# Patient Record
Sex: Female | Born: 1990 | Race: Black or African American | Hispanic: No | Marital: Single | Smoking: Never smoker
Health system: Southern US, Community
[De-identification: ages and names within clinical notes are randomized; demographics above are authoritative.]

## PROBLEM LIST (undated history)

## (undated) DIAGNOSIS — F419 Anxiety disorder, unspecified: Secondary | ICD-10-CM

## (undated) DIAGNOSIS — F32A Depression, unspecified: Secondary | ICD-10-CM

## (undated) DIAGNOSIS — F329 Major depressive disorder, single episode, unspecified: Secondary | ICD-10-CM

## (undated) DIAGNOSIS — T7840XA Allergy, unspecified, initial encounter: Secondary | ICD-10-CM

## (undated) HISTORY — DX: Anxiety disorder, unspecified: F41.9

## (undated) HISTORY — DX: Allergy, unspecified, initial encounter: T78.40XA

## (undated) HISTORY — DX: Depression, unspecified: F32.A

## (undated) HISTORY — DX: Major depressive disorder, single episode, unspecified: F32.9

---
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
## 2015-11-11 ENCOUNTER — Ambulatory Visit (INDEPENDENT_AMBULATORY_CARE_PROVIDER_SITE_OTHER): Payer: Self-pay | Admitting: Psychiatry

## 2015-11-11 ENCOUNTER — Encounter (HOSPITAL_COMMUNITY): Payer: Self-pay | Admitting: Psychiatry

## 2015-11-11 VITALS — BP 122/78 | HR 86 | Ht 60.0 in | Wt 132.0 lb

## 2015-11-11 DIAGNOSIS — F331 Major depressive disorder, recurrent, moderate: Secondary | ICD-10-CM

## 2015-11-11 DIAGNOSIS — F411 Generalized anxiety disorder: Secondary | ICD-10-CM

## 2015-11-11 MED ORDER — CITALOPRAM HYDROBROMIDE 20 MG PO TABS: 20.0000 mg | ORAL_TABLET | Freq: Every day | ORAL | Status: DC
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
## 2015-11-28 ENCOUNTER — Ambulatory Visit (INDEPENDENT_AMBULATORY_CARE_PROVIDER_SITE_OTHER): Payer: 59 | Admitting: Psychiatry

## 2015-11-28 ENCOUNTER — Encounter (HOSPITAL_COMMUNITY): Payer: Self-pay | Admitting: Psychiatry

## 2015-11-28 VITALS — BP 116/70 | HR 83 | Ht 60.0 in | Wt 132.0 lb

## 2015-11-28 DIAGNOSIS — F411 Generalized anxiety disorder: Secondary | ICD-10-CM

## 2015-11-28 DIAGNOSIS — F331 Major depressive disorder, recurrent, moderate: Secondary | ICD-10-CM | POA: Diagnosis not present

## 2015-11-28 MED ORDER — CITALOPRAM HYDROBROMIDE 20 MG PO TABS: 20.0000 mg | ORAL_TABLET | Freq: Every day | ORAL | Status: DC

## 2015-11-28 NOTE — Progress Notes (Signed)
Patient ID: Michelle Cantu, female   DOB: July 17, 1991, 25 y.o.   MRN: [id number]  Psychiatric Initial Adult Assessment   Patient Identification: Michelle Cantu MRN:  [id number] Date of Evaluation:  11/28/2015 Referral Source: Rolin Barry Therapist Chief Complaint:   Chief Complaint    Follow-up     Visit Diagnosis:    ICD-9-CM ICD-10-CM   1. Moderate episode of recurrent major depressive disorder (HCC) 296.32 F33.1   2. GAD (generalized anxiety disorder) 300.02 F41.1    Diagnosis:  There are no active problems to display for this patient.  History of Present Illness:  25 years old currently single African-American female initially referred by her therapist for evaluation of depression and management.  Patient was depressed and tearful last visit. We added Celexa she is responding less depressed. She still has some sleep concerns but overall energy and mood has improved  Aggravating factors; multiple losses. Grandparents died in 2013/12/25.Marland Kitchen Sister had a stillborn. History of dad being in prison Modifying factors; her sister and her group of friends  Location; Depression, anxiety Severity; 6 out of 10. 10 being no depression. (improved) Context; losses of family members Timing; more so the afternoon  Denies psychotic symptoms or mania.  Denies drug use or regular alcohol use.  Depression Symptoms:   Not tearful. Mood better. Sleep remains a concern (Hypo) Manic Symptoms:  Distractibility, Anxiety Symptoms:  Excessive Worry,(improving) Psychotic Symptoms:  denies     Past Medical History: History reviewed. No pertinent past medical history. History reviewed. No pertinent past surgical history. Family History:  Family History  Problem Relation Age of Onset  . Anxiety disorder Mother   . Anxiety disorder Sister    Social History:   Social History   Social History  . Marital Status: Single    Spouse Name: N/A  . Number of Children: N/A  . Years of Education: N/A    Social History Main Topics  . Smoking status: Never Smoker   . Smokeless tobacco: None  . Alcohol Use: No  . Drug Use: No  . Sexual Activity: Not Asked   Other Topics Concern  . None   Social History Narrative     Musculoskeletal: Strength & Muscle Tone: within normal limits Gait & Station: normal Patient leans: N/A  Psychiatric Specialty Exam: HPI  ROS  Blood pressure 116/70, pulse 83, height 5' (1.524 m), weight 132 lb (59.875 kg), last menstrual period 10/27/2015, SpO2 97 %.Body mass index is 25.78 kg/(m^2).  General Appearance: Casual  Eye Contact:  Fair  Speech:  Normal Rate  Volume:  Decreased  Mood:  Less dysphoric  Affect:  More reactive  Thought Process:  Coherent  Orientation:  Full (Time, Place, and Person)  Thought Content:  Rumination  Suicidal Thoughts:  No  Homicidal Thoughts:  No  Memory:  Immediate;   Fair Recent;   Fair  Judgement:  Fair  Insight:  Shallow  Psychomotor Activity:  Normal  Concentration:  Fair  Recall:  [employer] of Knowledge:Fair  Language: Fair  Akathisia:  Negative  Handed:  Right  AIMS (if indicated):    Assets:  Desire for Improvement Social Support  ADL's:  Intact  Cognition: WNL  Sleep:  Variable to poor   Is the patient at risk to self?  No. Has the patient been a risk to self in the past 6 months?  No. Has the patient been a risk to self within the distant past?  No.  Allergies:  No Known  Allergies Current Medications: Current Outpatient Prescriptions  Medication Sig Dispense Refill  . citalopram (CELEXA) 20 MG tablet Take 1 tablet (20 mg total) by mouth daily. Then start one of 20mg  a day. 30 tablet 0  . dicyclomine (BENTYL) 10 MG capsule Take 10 mg by mouth daily.     No current facility-administered medications for this visit.     Treatment Plan Summary: Medication management and Plan as follows  Major depressive disorder; continue celexa 20mg  Generalized anxiety disorder; Celexa as  above Insomnia; reviewed sleep hygiene. Will consider sleep aid if needed but not now. Patient is a nonsmoker Continue in therapy which is helpful to deal with her depression and psychosocial issues including losses. Increase activity during the day and spend some time for physical activity or  More than 50% time spent in counseling and coordination of care including patient education Call 911 or report of emergency room for any urgent concerns of suicidal thoughts Follow-up in 4 weeks or earlier if needed    Flynn Lininger 4/7/201712:08 PM

## 2015-12-08 ENCOUNTER — Ambulatory Visit: Payer: Self-pay | Admitting: Pediatrics

## 2015-12-25 ENCOUNTER — Ambulatory Visit (HOSPITAL_COMMUNITY): Payer: Self-pay | Admitting: Psychiatry

## 2016-04-01 ENCOUNTER — Emergency Department (HOSPITAL_BASED_OUTPATIENT_CLINIC_OR_DEPARTMENT_OTHER): Payer: 59

## 2016-04-01 ENCOUNTER — Emergency Department (HOSPITAL_BASED_OUTPATIENT_CLINIC_OR_DEPARTMENT_OTHER)
Admission: EM | Admit: 2016-04-01 | Discharge: 2016-04-01 | Disposition: A | Discharge status: Home / Self Care | Payer: 59 | Attending: Emergency Medicine | Admitting: Emergency Medicine

## 2016-04-01 ENCOUNTER — Encounter (HOSPITAL_BASED_OUTPATIENT_CLINIC_OR_DEPARTMENT_OTHER): Payer: Self-pay | Admitting: *Deleted

## 2016-04-01 DIAGNOSIS — Y9389 Activity, other specified: Secondary | ICD-10-CM | POA: Insufficient documentation

## 2016-04-01 DIAGNOSIS — M25512 Pain in left shoulder: Secondary | ICD-10-CM | POA: Diagnosis not present

## 2016-04-01 DIAGNOSIS — Y92410 Unspecified street and highway as the place of occurrence of the external cause: Secondary | ICD-10-CM | POA: Insufficient documentation

## 2016-04-01 DIAGNOSIS — M79632 Pain in left forearm: Secondary | ICD-10-CM | POA: Diagnosis not present

## 2016-04-01 DIAGNOSIS — S39012A Strain of muscle, fascia and tendon of lower back, initial encounter: Secondary | ICD-10-CM | POA: Diagnosis not present

## 2016-04-01 DIAGNOSIS — S0990XA Unspecified injury of head, initial encounter: Secondary | ICD-10-CM | POA: Diagnosis present

## 2016-04-01 DIAGNOSIS — S161XXA Strain of muscle, fascia and tendon at neck level, initial encounter: Secondary | ICD-10-CM | POA: Insufficient documentation

## 2016-04-01 DIAGNOSIS — Y999 Unspecified external cause status: Secondary | ICD-10-CM | POA: Insufficient documentation

## 2016-04-01 MED ORDER — FLUCONAZOLE 50 MG PO TABS: 50.0000 mg | ORAL_TABLET | Freq: Once | ORAL | Status: DC

## 2016-04-01 MED ORDER — NAPROXEN 500 MG PO TABS: 500.0000 mg | ORAL_TABLET | Freq: Two times a day (BID) | ORAL | 1 refills | Status: DC

## 2016-04-01 MED FILL — NAPROXEN 500 MG TABLET: 500 | 7 days supply | Qty: 14 | Fill #0
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
## 2016-11-02 ENCOUNTER — Emergency Department (HOSPITAL_BASED_OUTPATIENT_CLINIC_OR_DEPARTMENT_OTHER)
Admission: EM | Admit: 2016-11-02 | Discharge: 2016-11-02 | Disposition: A | Discharge status: Home / Self Care | Payer: 59 | Attending: Emergency Medicine | Admitting: Emergency Medicine

## 2016-11-02 ENCOUNTER — Encounter (HOSPITAL_BASED_OUTPATIENT_CLINIC_OR_DEPARTMENT_OTHER): Payer: Self-pay | Admitting: *Deleted

## 2016-11-02 DIAGNOSIS — J029 Acute pharyngitis, unspecified: Secondary | ICD-10-CM | POA: Diagnosis present

## 2016-11-02 DIAGNOSIS — J069 Acute upper respiratory infection, unspecified: Secondary | ICD-10-CM

## 2016-11-02 DIAGNOSIS — J45909 Unspecified asthma, uncomplicated: Secondary | ICD-10-CM | POA: Insufficient documentation

## 2016-11-02 LAB — RAPID STREP SCREEN (MED CTR MEBANE ONLY): Streptococcus, Group A Screen (Direct): NEGATIVE

## 2016-11-02 MED ORDER — ACETAMINOPHEN 325 MG PO TABS
650.0000 mg | ORAL_TABLET | Freq: Once | ORAL | Status: AC
  Administered 2016-11-02: 650 mg via ORAL
  Filled 2016-11-02: qty 2

## 2016-11-02 MED ORDER — FLUTICASONE PROPIONATE 50 MCG/ACT NA SUSP: 2.0000 | Freq: Every day | NASAL | 0 refills | Status: AC

## 2016-11-02 MED ORDER — DM-GUAIFENESIN ER 30-600 MG PO TB12: 1.0000 | ORAL_TABLET | Freq: Two times a day (BID) | ORAL | 0 refills | Status: DC | PRN

## 2016-11-02 MED ORDER — BENZONATATE 100 MG PO CAPS: 100.0000 mg | ORAL_CAPSULE | Freq: Three times a day (TID) | ORAL | 0 refills | Status: DC

## 2016-11-02 NOTE — ED Triage Notes (Signed)
Sore throat, cough, chills and body aches x 2 weeks. She took a round of Amoxicillin with no relief.

## 2016-11-02 NOTE — ED Provider Notes (Signed)
[facility] Provider Note   CSN: [id number] Arrival date & time: 11/02/16  1744   By signing my name below, I, Clarisse Gouge, attest that this documentation has been prepared under the direction and in the presence of Weylyn Ricciuti, [place]. Electronically Signed: Clarisse Gouge, Scribe. 11/02/16. 2:29 AM.   History   Chief Complaint Chief Complaint  Patient presents with  . Sore Throat  . Chills   The history is provided by the patient and medical records. No language interpreter was used.    HPI Comments: Michelle Cantu is a 26 y.o. female with Hx of asthma who presents to the Emergency Department complaining of worsened cough x 2 days. She states she was seen for sore throat and URI symptoms 2 weeks ago and prescribed amoxicillin with relief to sore throat, but she notes returned and worsened symptoms besides sore throat currently. Pt reports associated congestion, rhinorrhea, productive cough with yellow sputum last night and clear sputum today, body aches, chills, chest pain, SOB and nausea. She notes no Hx of tobacco use. Pt denies fever, chills, vomiting, diarrhea, constipation and abdominal pain.  History reviewed. No pertinent past medical history.  There are no active problems to display for this patient.   History reviewed. No pertinent surgical history.  OB History    No data available       Home Medications    Prior to Admission medications   Medication Sig Start Date End Date Taking? Authorizing Provider  benzonatate (TESSALON) 100 MG capsule Take 1 capsule (100 mg total) by mouth every 8 (eight) hours. 11/02/16   Shadawn Hanaway Manuel [place], [place]  citalopram (CELEXA) 20 MG tablet Take 1 tablet (20 mg total) by mouth daily. Then start one of 20mg  a day. 11/28/15   Thresa Ross, MD  dextromethorphan-guaiFENesin (MUCINEX DM) 30-600 MG 12hr tablet Take 1 tablet by mouth 2 (two) times daily as needed for cough. 11/02/16   Sadarius Norman [place], [place]    dicyclomine (BENTYL) 10 MG capsule Take 10 mg by mouth daily.    Historical Provider, MD  fluticasone (FLONASE) 50 MCG/ACT nasal spray Place 2 sprays into both nostrils daily. 11/02/16   Idil Maslanka Manuel , [place]  naproxen (NAPROSYN) 500 MG tablet Take 1 tablet (500 mg total) by mouth 2 (two) times daily. 04/01/16   Vanetta Mulders, MD    Family History Family History  Problem Relation Age of Onset  . Anxiety disorder Mother   . Anxiety disorder Sister     Social History Social History  Substance Use Topics  . Smoking status: Never Smoker  . Smokeless tobacco: Never Used  . Alcohol use No     Allergies   Patient has no known allergies.   Review of Systems Review of Systems  Constitutional: Positive for chills. Negative for fever.  HENT: Positive for congestion and rhinorrhea. Negative for sore throat.   Respiratory: Positive for cough and shortness of breath.   Cardiovascular: Positive for chest pain.  Musculoskeletal: Positive for arthralgias and myalgias.     Physical Exam Updated Vital Signs BP 120/75   Pulse 112   Temp 98.8 F (37.1 C) (Oral)   Resp 20   Ht 5' (1.524 m)   Wt 156 lb (70.8 kg)   LMP 10/29/2016   SpO2 100%   BMI 30.47 kg/m   Physical Exam  Constitutional: She is oriented to person, place, and time. She appears well-developed and well-nourished.  Well appearing  HENT:  Head: Normocephalic and atraumatic.  Right Ear: External ear normal.  Left Ear: External ear normal.  Nose: Nose normal.  Mouth/Throat: Oropharynx is clear and moist. No oropharyngeal exudate.  Oropharynx without evidence of redness or exudates. Tonsils without evidence of redness, swelling, or exudates. TM's appear normal with no evidence of bulging. EAC appear non erythematous and not swollen  Eyes: EOM are normal. Pupils are equal, round, and reactive to light.  Neck: Normal range of motion.  Normal ROM. No nuchal rigidity.   Cardiovascular: Normal rate and normal  heart sounds.   Pulmonary/Chest: Effort normal and breath sounds normal. No respiratory distress. She has no wheezes. She has no rales.  Lungs CTA. No wheezing. No rales. No stridor. Normal work of breathing  Abdominal: Soft. There is no tenderness. There is no rebound and no guarding.  Soft and nontender. No rebound. No guarding. Negative murphy's sign. No focal tenderness at McBurney's point. No CVA tenderness. No evidence of hernia  Neurological: She is alert and oriented to person, place, and time.  Skin: Skin is warm.  Psychiatric: She has a normal mood and affect. Her behavior is normal.  Nursing note and vitals reviewed.    ED Treatments / Results  DIAGNOSTIC STUDIES: Oxygen Saturation is 100% on RA, NL by my interpretation.    COORDINATION OF CARE: 9:37 PM Discussed treatment plan with pt at bedside and pt agreed to plan. Will order medications and prepare pt for discharge.  Labs (all labs ordered are listed, but only abnormal results are displayed) Labs Reviewed  RAPID STREP SCREEN (NOT [facility])  CULTURE, GROUP A STREP [facility])    EKG  EKG Interpretation None       Radiology No results found.  Procedures Procedures (including critical care time)  Medications Ordered in ED Medications  acetaminophen (TYLENOL) tablet 650 mg (650 mg Oral Given 11/02/16 1756)     Initial Impression / Assessment and Plan / ED Course  I have reviewed the triage vital signs and the nursing notes.  Pertinent labs & imaging results that were available during my care of the patient were reviewed by me and considered in my medical decision making (see chart for details).     Patients symptoms are consistent with URI, likely viral etiology. On exam, pt in NAD. Hemodynamically stable. No hypoxia. Afebrile. Lungs clear, Heart sounds clear. Normal work of breathing. TMs clear. Throat benign. Abdomen nontender/soft.  Strep negative.  Discussed that antibiotics are not indicated for viral  infections. Pt will be discharged with symptomatic treatment.  Verbalizes understanding and is agreeable with plan. Pt is hemodynamically stable & in NAD prior to dc. Reasons to immediately return to the emergency department discussed.  I personally performed the services described in this documentation, which was scribed in my presence. The recorded information has been reviewed and is accurate.   Final Clinical Impressions(s) / ED Diagnoses   Final diagnoses:  Upper respiratory tract infection, unspecified type    New Prescriptions Discharge Medication List as of 11/02/2016  9:53 PM    START taking these medications   Details  benzonatate (TESSALON) 100 MG capsule Take 1 capsule (100 mg total) by mouth every 8 (eight) hours., Starting Tue 11/02/2016, Print    dextromethorphan-guaiFENesin (MUCINEX DM) 30-600 MG 12hr tablet Take 1 tablet by mouth 2 (two) times daily as needed for cough., Starting Tue 11/02/2016, Print    fluticasone (FLONASE) 50 MCG/ACT nasal spray Place 2 sprays into both nostrils daily., Starting Tue 11/02/2016, Print  [address] [place], [place] 11/03/16 1610    Loren Racer, MD 11/05/16 [phone]

## 2016-11-02 NOTE — Discharge Instructions (Signed)
1. Medications: flonase, mucinex, tessalon, usual home medications °2. Treatment: rest, drink plenty of fluids, take tylenol or ibuprofen for fever control °3. Follow Up: Please followup with your primary doctor in 3 days for discussion of your diagnoses and further evaluation after today's visit; if you do not have a primary care doctor use the resource guide provided to find one; Return to the ER for high fevers, difficulty breathing or other concerning symptoms  ° °Contact a health care provider if: °You are getting worse rather than better. °Your symptoms are not controlled by medicine. °You have chills. °You have worsening shortness of breath. °You have brown or red mucus. °You have yellow or brown nasal discharge. °You have pain in your face, especially when you bend forward. °You have a fever. °You have swollen neck glands. °You have pain while swallowing. °You have white areas in the back of your throat. °Get help right away if: °You have severe or persistent: °Headache. °Ear pain. °Sinus pain. °Chest pain. °You have chronic lung disease and any of the following: °Wheezing. °Prolonged cough. °Coughing up blood. °A change in your usual mucus. °You have a stiff neck. °You have changes in your: °Vision. °Hearing. °Thinking. °Mood. °

## 2016-11-05 LAB — CULTURE, GROUP A STREP (THRC)
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
## 2018-08-03 ENCOUNTER — Encounter: Payer: Self-pay | Admitting: Emergency Medicine

## 2018-08-03 ENCOUNTER — Other Ambulatory Visit: Payer: Self-pay

## 2018-08-03 ENCOUNTER — Ambulatory Visit (INDEPENDENT_AMBULATORY_CARE_PROVIDER_SITE_OTHER): Payer: 59 | Admitting: Emergency Medicine

## 2018-08-03 VITALS — BP 143/78 | HR 76 | Temp 98.8°F | Ht 60.0 in | Wt 150.2 lb

## 2018-08-03 DIAGNOSIS — N390 Urinary tract infection, site not specified: Secondary | ICD-10-CM | POA: Diagnosis not present

## 2018-08-03 DIAGNOSIS — R3989 Other symptoms and signs involving the genitourinary system: Secondary | ICD-10-CM | POA: Diagnosis not present

## 2018-08-03 LAB — POCT URINALYSIS DIP (MANUAL ENTRY)
BILIRUBIN UA: NEGATIVE mg/dL
Bilirubin, UA: NEGATIVE
Glucose, UA: NEGATIVE mg/dL
NITRITE UA: NEGATIVE
PROTEIN UA: NEGATIVE mg/dL
Spec Grav, UA: 1.02 (ref 1.010–1.025)
Urobilinogen, UA: 0.2 E.U./dL
pH, UA: 6.5 (ref 5.0–8.0)

## 2018-08-03 MED ORDER — FLUCONAZOLE 150 MG PO TABS: 150.0000 mg | ORAL_TABLET | Freq: Once | ORAL | 0 refills | Status: AC

## 2018-08-03 MED ORDER — SULFAMETHOXAZOLE-TRIMETHOPRIM 800-160 MG PO TABS: 1.0000 | ORAL_TABLET | Freq: Two times a day (BID) | ORAL | 0 refills | Status: AC

## 2018-08-03 NOTE — Progress Notes (Signed)
Michelle Cantu 27 y.o.   Chief Complaint  Patient presents with  . Polyuria    HISTORY OF PRESENT ILLNESS: This is a 27 y.o. female complaining of UTI symptoms for 2 weeks.  Went to urgent care clinic.  Tested but not treated.  Patient denies fever or chills, nausea or vomiting, or flank pain.  Urinary Tract Infection   This is a new problem. The current episode started 1 to 4 weeks ago. The problem has been gradually worsening. The quality of the pain is described as burning. The pain is at a severity of 5/10. The pain is moderate. There has been no fever. She is sexually active. There is no history of pyelonephritis. Associated symptoms include frequency and urgency. Pertinent negatives include no chills, discharge, flank pain, hematuria, nausea, possible pregnancy, sweats or vomiting. Treatments tried: azo. The treatment provided mild relief. There is no history of catheterization, kidney stones, recurrent UTIs or a urological procedure.     Prior to Admission medications   Medication Sig Start Date End Date Taking? Authorizing Provider  fluconazole (DIFLUCAN) 150 MG tablet Take 1 tablet (150 mg total) by mouth once for 1 dose. 08/03/18 08/03/18  Georgina QuintSagardia, Kandie Keiper Jose, MD  fluticasone [facility](FLONASE) 50 MCG/ACT nasal spray Place 2 sprays into both nostrils daily. 11/02/16   Alvina ChouEspina, Francisco Manuel, PA  sulfamethoxazole-trimethoprim (BACTRIM DS,SEPTRA DS) 800-160 MG tablet Take 1 tablet by mouth 2 (two) times daily for 7 days. 08/03/18 08/10/18  Georgina QuintSagardia, Laporshia Hogen Jose, MD    No Known Allergies  There are no active problems to display for this patient.   Past Medical History:  Diagnosis Date  . Allergy   . Anxiety   . Depression     History reviewed. No pertinent surgical history.  Social History   Socioeconomic History  . Marital status: Single    Spouse name: Not on file  . Number of children: Not on file  . Years of education: Not on file  . Highest education level: Not on  file  Occupational History  . Not on file  Social Needs  . Financial resource strain: Not on file  . Food insecurity:    Worry: Not on file    Inability: Not on file  . Transportation needs:    Medical: Not on file    Non-medical: Not on file  Tobacco Use  . Smoking status: Never Smoker  . Smokeless tobacco: Never Used  Substance and Sexual Activity  . Alcohol use: No    Alcohol/week: 0.0 standard drinks  . Drug use: No  . Sexual activity: Yes  Lifestyle  . Physical activity:    Days per week: Not on file    Minutes per session: Not on file  . Stress: Not on file  Relationships  . Social connections:    Talks on phone: Not on file    Gets together: Not on file    Attends religious service: Not on file    Active member of club or organization: Not on file    Attends meetings of clubs or organizations: Not on file    Relationship status: Not on file  . Intimate partner violence:    Fear of current or ex partner: Not on file    Emotionally abused: Not on file    Physically abused: Not on file    Forced sexual activity: Not on file  Other Topics Concern  . Not on file  Social History Narrative  . Not on file  Family History  Problem Relation Age of Onset  . Anxiety disorder Mother   . Diabetes Mother   . Stroke Mother   . Healthy Sister   . Healthy Father   . Healthy Brother   . Anxiety disorder Brother      Review of Systems  Constitutional: Negative for chills and fever.  HENT: Negative.  Negative for sore throat.   Eyes: Negative.  Negative for discharge and redness.  Respiratory: Negative.  Negative for cough and shortness of breath.   Cardiovascular: Negative.  Negative for chest pain and palpitations.  Gastrointestinal: Negative.  Negative for abdominal pain, diarrhea, nausea and vomiting.  Genitourinary: Positive for dysuria, frequency and urgency. Negative for flank pain and hematuria.  Skin: Negative.  Negative for rash.  Neurological: Negative  for dizziness and headaches.  Endo/Heme/Allergies: Negative.   All other systems reviewed and are negative.  Vitals:   08/03/18 1343  BP: (!) 143/78  Pulse: 76  Temp: 98.8 F (37.1 C)  SpO2: 100%     Physical Exam Vitals signs reviewed.  Constitutional:      Appearance: Normal appearance.  HENT:     Head: Normocephalic and atraumatic.  Eyes:     Pupils: Pupils are equal, round, and reactive to light.  Neck:     Musculoskeletal: Normal range of motion.  Cardiovascular:     Rate and Rhythm: Normal rate and regular rhythm.     Pulses: Normal pulses.  Pulmonary:     Effort: Pulmonary effort is normal.     Breath sounds: Normal breath sounds.  Abdominal:     General: Abdomen is flat. There is no distension.     Tenderness: There is no abdominal tenderness. There is no right CVA tenderness or left CVA tenderness.  Musculoskeletal: Normal range of motion.  Skin:    General: Skin is warm and dry.     Capillary Refill: Capillary refill takes less than 2 seconds.  Neurological:     General: No focal deficit present.     Mental Status: She is alert and oriented to person, place, and time.  Psychiatric:        Mood and Affect: Mood normal.      Results for orders placed or performed in visit on 08/03/18 (from the past 24 hour(s))  POCT urinalysis dipstick     Status: Abnormal   Collection Time: 08/03/18  1:49 PM  Result Value Ref Range   Color, UA yellow yellow   Clarity, UA cloudy (A) clear   Glucose, UA negative negative mg/dL   Bilirubin, UA negative negative   Ketones, POC UA negative negative mg/dL   Spec Grav, UA 1.610 9.604 - 1.025   Blood, UA trace-lysed (A) negative   pH, UA 6.5 5.0 - 8.0   Protein Ur, POC negative negative mg/dL   Urobilinogen, UA 0.2 0.2 or 1.0 E.U./dL   Nitrite, UA Negative Negative   Leukocytes, UA Large (3+) (A) Negative     ASSESSMENT & PLAN: Landen was seen today for polyuria.  Diagnoses and all orders for this visit:  Acute  UTI -     sulfamethoxazole-trimethoprim (BACTRIM DS,SEPTRA DS) 800-160 MG tablet; Take 1 tablet by mouth 2 (two) times daily for 7 days. -     fluconazole (DIFLUCAN) 150 MG tablet; Take 1 tablet (150 mg total) by mouth once for 1 dose.  Urinary problem -     POCT urinalysis dipstick -     Urine Culture    Patient  Instructions       If you have lab work done today you will be contacted with your lab results within the next 2 weeks.  If you have not heard from [place] then please contact us. The fastest way to get your results is to register for My Chart.   IF you received an x-ray today, you will receive an invoice from [facility]. Please contact [facility] at [phone] with questions or concerns regarding your invoice.   IF you received labwork today, you will receive an invoice from [facility]. Please contact [facility] at [phone] with questions or concerns regarding your invoice.   Our billing staff will not be able to assist you with questions regarding bills from these companies.  You will be contacted with the lab results as soon as they are available. The fastest way to get your results is to activate your My Chart account. Instructions are located on the last page of this paperwork. If you have not heard from [place] regarding the results in 2 weeks, please contact this office.     Urinary Tract Infection, Adult A urinary tract infection (UTI) is an infection of any part of the urinary tract. The urinary tract includes the:  Kidneys.  Ureters.  Bladder.  Urethra.  These organs make, store, and get rid of pee (urine) in the body. Follow these instructions at home:  Take over-the-counter and prescription medicines only as told by your doctor.  If you were prescribed an antibiotic medicine, take it as told by your doctor. Do not stop taking the antibiotic even if you start to feel better.  Avoid the following  drinks: ? Alcohol. ? Caffeine. ? Tea. ? Carbonated drinks.  Drink enough fluid to keep your pee clear or pale yellow.  Keep all follow-up visits as told by your doctor. This is important.  Make sure to: ? Empty your bladder often and completely. Do not to hold pee for long periods of time. ? Empty your bladder before and after sex. ? Wipe from front to back after a bowel movement if you are female. Use each tissue one time when you wipe. Contact a doctor if:  You have back pain.  You have a fever.  You feel sick to your stomach (nauseous).  You throw up (vomit).  Your symptoms do not get better after 3 days.  Your symptoms go away and then come back. Get help right away if:  You have very bad back pain.  You have very bad lower belly (abdominal) pain.  You are throwing up and cannot keep down any medicines or water. This information is not intended to replace advice given to you by your health care provider. Make sure you discuss any questions you have with your health care provider. Document Released: 01/26/2008 Document Revised: 01/15/2016 Document Reviewed: 06/30/2015 Elsevier Interactive Patient Education  2018 Elsevier Inc.      Edwina Barth, MD [facility]

## 2018-08-03 NOTE — Patient Instructions (Addendum)
     If you have lab work done today you will be contacted with your lab results within the next 2 weeks.  If you have not heard from us then please contact us. The fastest way to get your results is to register for My Chart.   IF you received an x-ray today, you will receive an invoice from [facility]. Please contact [facility] at [phone] with questions or concerns regarding your invoice.   IF you received labwork today, you will receive an invoice from [facility]. Please contact [facility] at [phone] with questions or concerns regarding your invoice.   Our billing staff will not be able to assist you with questions regarding bills from these companies.  You will be contacted with the lab results as soon as they are available. The fastest way to get your results is to activate your My Chart account. Instructions are located on the last page of this paperwork. If you have not heard from us regarding the results in 2 weeks, please contact this office.     Urinary Tract Infection, Adult A urinary tract infection (UTI) is an infection of any part of the urinary tract. The urinary tract includes the:  Kidneys.  Ureters.  Bladder.  Urethra.  These organs make, store, and get rid of pee (urine) in the body. Follow these instructions at home:  Take over-the-counter and prescription medicines only as told by your doctor.  If you were prescribed an antibiotic medicine, take it as told by your doctor. Do not stop taking the antibiotic even if you start to feel better.  Avoid the following drinks: ? Alcohol. ? Caffeine. ? Tea. ? Carbonated drinks.  Drink enough fluid to keep your pee clear or pale yellow.  Keep all follow-up visits as told by your doctor. This is important.  Make sure to: ? Empty your bladder often and completely. Do not to hold pee for long periods of time. ? Empty your bladder before and after sex. ? Wipe from front to back after a  bowel movement if you are female. Use each tissue one time when you wipe. Contact a doctor if:  You have back pain.  You have a fever.  You feel sick to your stomach (nauseous).  You throw up (vomit).  Your symptoms do not get better after 3 days.  Your symptoms go away and then come back. Get help right away if:  You have very bad back pain.  You have very bad lower belly (abdominal) pain.  You are throwing up and cannot keep down any medicines or water. This information is not intended to replace advice given to you by your health care provider. Make sure you discuss any questions you have with your health care provider. Document Released: 01/26/2008 Document Revised: 01/15/2016 Document Reviewed: 06/30/2015 Elsevier Interactive Patient Education  2018 Elsevier Inc.  

## 2018-09-26 ENCOUNTER — Telehealth: Payer: 59 | Admitting: Physician Assistant

## 2018-09-26 ENCOUNTER — Other Ambulatory Visit: Payer: Self-pay | Admitting: Emergency Medicine

## 2018-09-26 DIAGNOSIS — N898 Other specified noninflammatory disorders of vagina: Secondary | ICD-10-CM

## 2018-09-26 DIAGNOSIS — R350 Frequency of micturition: Secondary | ICD-10-CM

## 2018-09-26 DIAGNOSIS — N390 Urinary tract infection, site not specified: Secondary | ICD-10-CM

## 2018-09-26 NOTE — Progress Notes (Signed)
Based on what you shared with me it looks like you have a serious condition that should be evaluated in a face to face office visit.  NOTE: If you entered your credit card information for this eVisit, you will not be charged. You may see a "hold" on your card for the $30 but that hold will drop off and you will not have a charge processed.  If you are having a true medical emergency please call 911.  If you need an urgent face to face visit, [place] has four urgent care centers for your convenience.  If you need care fast and have a high deductible or no insurance consider:   [email or web address] to reserve your spot online an avoid wait times  [facility] [address], [address] [place], [place] [address] 8 am to 8 pm Monday-Friday 10 am to 4 pm Saturday-Sunday *Across the street from [employer]  [facility]  [address] [place], [address] 8 am to 5 pm Monday-Friday * In the [facility] the [facility]   The following sites will take your  insurance:  . [facility]  [phone] Get Driving Directions Find a Provider at this Location  [address] [place], [place] [address] . 10 am to 8 pm Monday-Friday . 12 pm to 8 pm Saturday-Sunday   . [facility]  [phone] Get Driving Directions Find a Provider at this Location  [address], [address] [place], [place] [address] . 8 am to 8 pm Monday-Friday . 9 am to 6 pm Saturday . 11 am to 6 pm Sunday   . [facility] [place]  [phone] Get Driving Directions  [address] [place], [place] [address] . 8 am to 8 pm Monday-Friday . 8 am to 4 pm Saturday-Sunday   Your e-visit answers were reviewed by a board certified advanced clinical practitioner to complete your personal care plan.  Thank you for using e-Visits.
